# Patient Record
Sex: Male | Born: 1960 | Race: White | Hispanic: No | Marital: Single | Smoking: Never smoker
Health system: Southern US, Community
[De-identification: ages and names within clinical notes are randomized; demographics above are authoritative.]

## PROBLEM LIST (undated history)

## (undated) HISTORY — PX: KNEE SURGERY: SHX244

## (undated) HISTORY — PX: NASAL SINUS SURGERY: SHX719

---

## 2000-09-12 ENCOUNTER — Encounter: Admission: RE | Admit: 2000-09-12 | Discharge: 2000-09-12 | Payer: Self-pay | Admitting: Orthopedic Surgery

## 2000-09-12 ENCOUNTER — Encounter: Payer: Self-pay | Admitting: Orthopedic Surgery

## 2002-10-09 ENCOUNTER — Ambulatory Visit (HOSPITAL_COMMUNITY): Admission: RE | Admit: 2002-10-09 | Discharge: 2002-10-09 | Payer: Self-pay | Admitting: Internal Medicine

## 2017-05-15 ENCOUNTER — Other Ambulatory Visit: Payer: Self-pay | Admitting: Internal Medicine

## 2017-05-15 DIAGNOSIS — R1032 Left lower quadrant pain: Secondary | ICD-10-CM

## 2017-05-17 ENCOUNTER — Other Ambulatory Visit: Payer: Self-pay | Admitting: Internal Medicine

## 2017-05-17 ENCOUNTER — Ambulatory Visit
Admission: RE | Admit: 2017-05-17 | Discharge: 2017-05-17 | Disposition: A | Discharge status: Home / Self Care | Payer: 59 | Source: Ambulatory Visit | Attending: Internal Medicine | Admitting: Internal Medicine

## 2017-05-17 DIAGNOSIS — R1032 Left lower quadrant pain: Secondary | ICD-10-CM

## 2017-10-29 ENCOUNTER — Other Ambulatory Visit: Payer: Self-pay

## 2017-10-29 ENCOUNTER — Ambulatory Visit (HOSPITAL_COMMUNITY)
Admission: EM | Admit: 2017-10-29 | Discharge: 2017-10-29 | Disposition: A | Discharge status: Home / Self Care | Payer: 59 | Attending: Urgent Care | Admitting: Urgent Care

## 2017-10-29 ENCOUNTER — Ambulatory Visit (INDEPENDENT_AMBULATORY_CARE_PROVIDER_SITE_OTHER): Payer: 59

## 2017-10-29 ENCOUNTER — Encounter (HOSPITAL_COMMUNITY): Payer: Self-pay | Admitting: Emergency Medicine

## 2017-10-29 DIAGNOSIS — M25572 Pain in left ankle and joints of left foot: Secondary | ICD-10-CM | POA: Diagnosis not present

## 2017-10-29 DIAGNOSIS — S93402A Sprain of unspecified ligament of left ankle, initial encounter: Secondary | ICD-10-CM

## 2017-10-29 DIAGNOSIS — M25472 Effusion, left ankle: Secondary | ICD-10-CM

## 2017-10-29 MED ORDER — IBUPROFEN 800 MG PO TABS
ORAL_TABLET | ORAL | Status: AC
Start: 2017-10-29 — End: 2017-10-29
  Filled 2017-10-29: qty 1

## 2017-10-29 MED ORDER — NAPROXEN SODIUM 550 MG PO TABS: 550.0000 mg | ORAL_TABLET | Freq: Two times a day (BID) | ORAL | 1 refills | Status: DC

## 2017-10-29 MED ORDER — IBUPROFEN 800 MG PO TABS
800.0000 mg | ORAL_TABLET | Freq: Once | ORAL | Status: AC
  Administered 2017-10-29: 800 mg via ORAL

## 2017-10-29 NOTE — ED Triage Notes (Signed)
Pt states "I missed a step yesterday and turned my ankle, really sore and swollen".

## 2017-10-29 NOTE — ED Provider Notes (Signed)
  MRN: [id number] DOB: [birth year]  Subjective:   Jaime Kline is a 56 y.o. male presenting for left ankle pain, swelling, redness, bruising. He rolled his ankle yesterday while at home. Has not tried any medications for relief. He is having significant difficulty. Denies history of gout. Denies history of kidney or liver disease.  Jaime Kline is not currently taking any medications and is allergic to penicillins.  Jaime Kline denies past medical and surgical history.   Objective:   Vitals: BP 134/84   Pulse 87   Temp 98.4 F (36.9 C)   Resp 16   SpO2 97%   Physical Exam  Constitutional: He is oriented to person, place, and time. He appears well-developed and well-nourished.  Cardiovascular: Normal rate.  Pulmonary/Chest: Effort normal.  Musculoskeletal:       Left ankle: He exhibits decreased range of motion (inversion, eversion) and swelling. He exhibits no ecchymosis, no deformity, no laceration and normal pulse. Tenderness. Lateral malleolus and medial malleolus tenderness found. No AITFL, no CF ligament, no posterior TFL, no head of 5th metatarsal and no proximal fibula tenderness found. Achilles tendon exhibits no pain and no defect.  Neurological: He is alert and oriented to person, place, and time.   Dg Ankle Complete Left  Result Date: 10/29/2017 CLINICAL DATA:  56 year old male post twisting injury with pain. Initial encounter. EXAM: LEFT ANKLE COMPLETE - 3+ VIEW COMPARISON:  None. FINDINGS: No fracture or dislocation. Lateral malleolar soft tissue swelling may indicate underlying soft tissue injury. Accessory ossicle. Tiny plantar spur. Minimal degenerative changes distal fibula. IMPRESSION: No fracture or dislocation. Lateral malleolar soft tissue swelling may indicate underlying soft tissue injury. Electronically Signed   By: Lacy DuverneySteven  Olson M.D.   On: 10/29/2017 12:09   Assessment and Plan :   Sprain of left ankle, unspecified ligament, initial encounter  Acute left ankle  pain  Left ankle swelling   Counseled on RICE method for his sprained ankle. Patient prefers to pick up ankle brace and crutches on his own due to his insurance policy. Ankle rehab reviewed. Start Anaprox for pain and inflammation. Return-to-clinic precautions discussed, patient verbalized understanding.   Jaime BambergMario Latrell Potempa, PA-C [facility]  10/29/2017  11:57 AM    Jaime BambergMani, Angelle Isais, PA-C 10/29/17 1241

## 2018-12-19 IMAGING — DX DG ANKLE COMPLETE 3+V*L*
3 series · 3 of 3 positions shown · non-contrast
Comparison: None.

CLINICAL DATA: 56-year-old male post twisting injury with pain.
Initial encounter.

EXAM:
LEFT ANKLE COMPLETE - 3+ VIEW

[ankle ap]
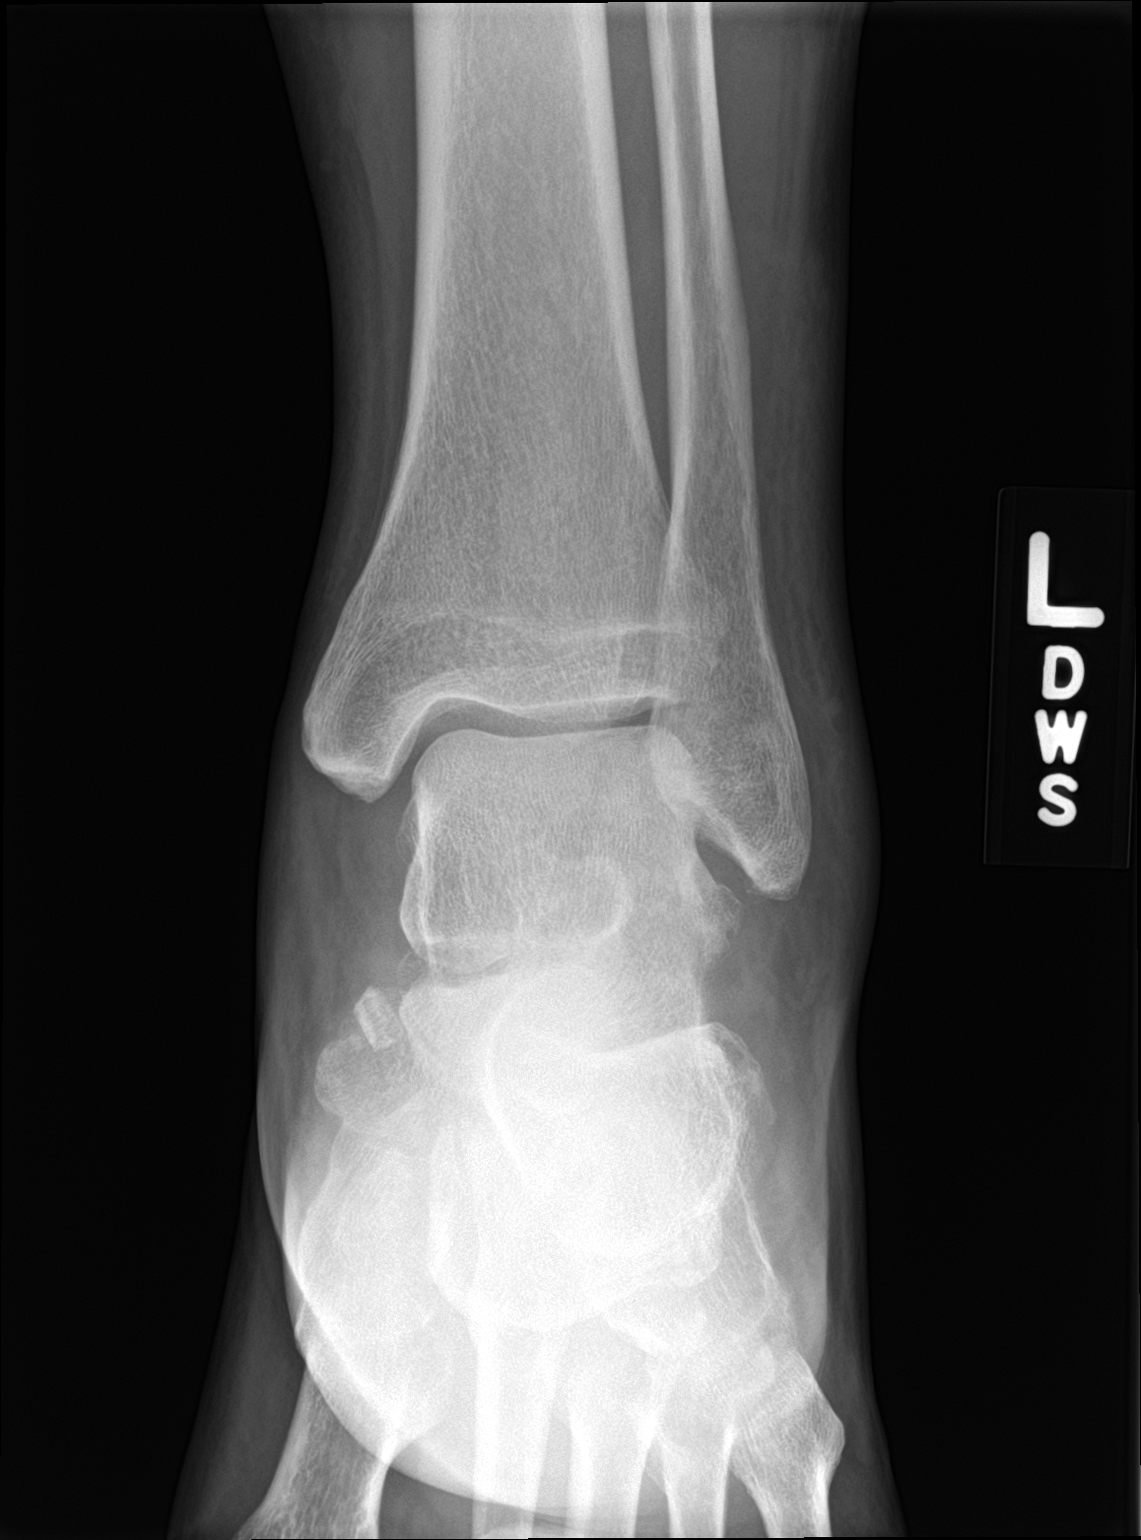

[ankle obl]
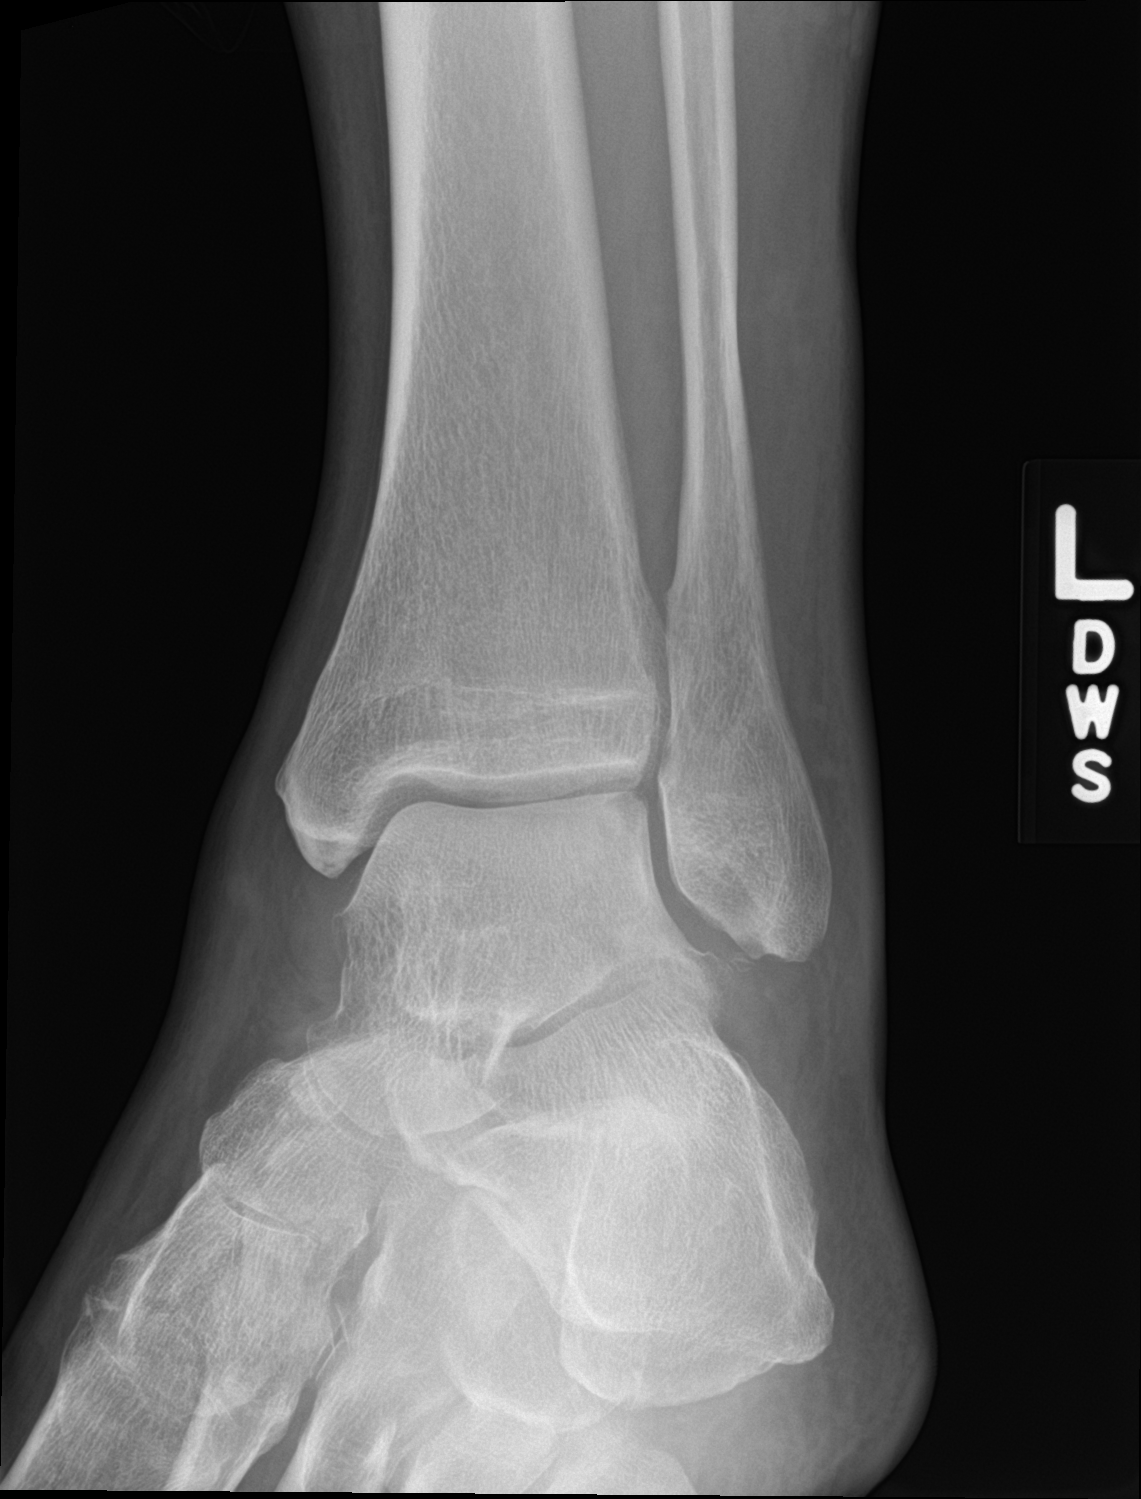

[ankle lat]
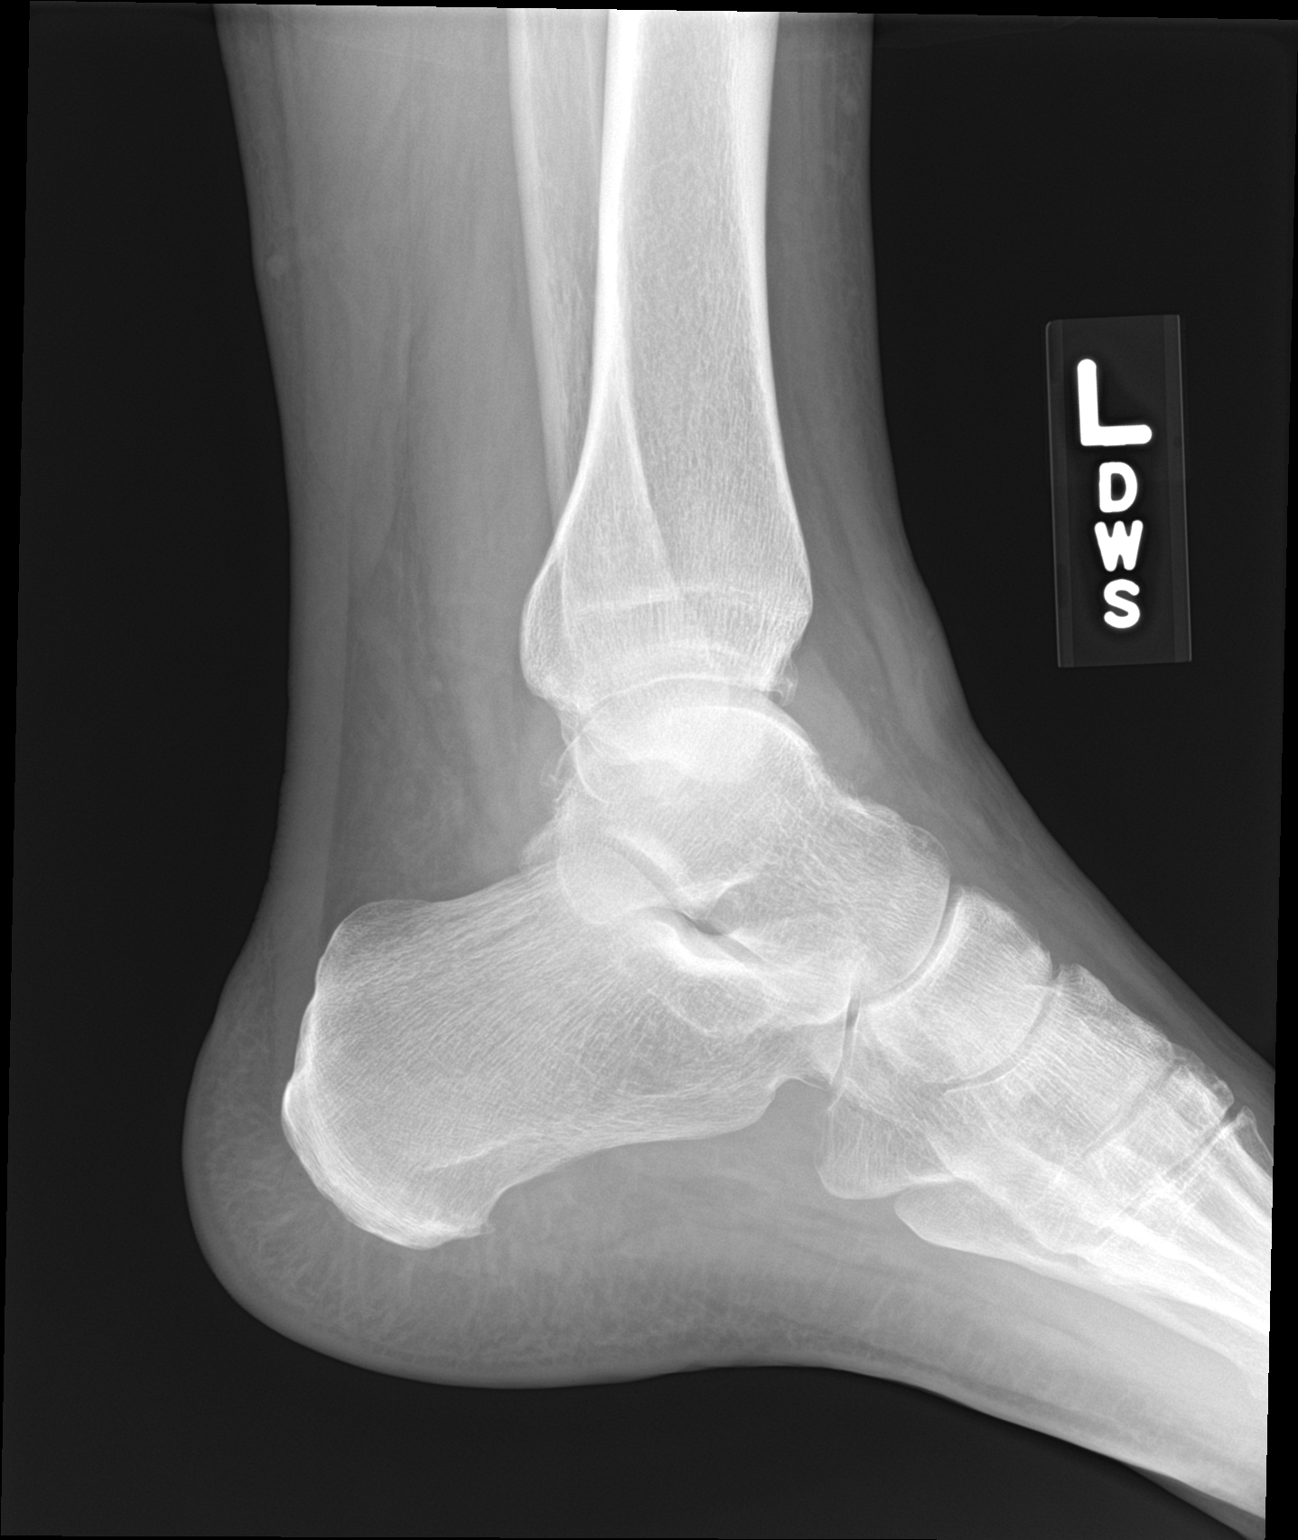

[3 of 3 positions shown; findings below may reference images not displayed]

FINDINGS: No fracture or dislocation.

Lateral malleolar soft tissue swelling may indicate underlying soft
tissue injury.

Accessory ossicle. Tiny plantar spur. Minimal degenerative changes
distal fibula.
IMPRESSION: No fracture or dislocation.

Lateral malleolar soft tissue swelling may indicate underlying soft
tissue injury.

## 2019-01-16 ENCOUNTER — Institutional Professional Consult (permissible substitution): Payer: 59 | Admitting: Internal Medicine

## 2019-01-16 ENCOUNTER — Encounter: Payer: Self-pay | Admitting: Pulmonary Disease

## 2019-01-16 ENCOUNTER — Ambulatory Visit: Payer: 59 | Admitting: Pulmonary Disease

## 2019-01-16 VITALS — BP 126/76 | HR 79 | Ht 69.0 in | Wt 214.2 lb

## 2019-01-16 DIAGNOSIS — J4551 Severe persistent asthma with (acute) exacerbation: Secondary | ICD-10-CM

## 2019-01-16 DIAGNOSIS — R05 Cough: Secondary | ICD-10-CM | POA: Diagnosis not present

## 2019-01-16 DIAGNOSIS — R059 Cough, unspecified: Secondary | ICD-10-CM

## 2019-01-16 LAB — NITRIC OXIDE: NITRIC OXIDE: 63

## 2019-01-16 MED ORDER — BUDESONIDE-FORMOTEROL FUMARATE 160-4.5 MCG/ACT IN AERO: 2.0000 | INHALATION_SPRAY | Freq: Two times a day (BID) | RESPIRATORY_TRACT | 5 refills | Status: DC

## 2019-01-16 MED ORDER — PREDNISONE 10 MG PO TABS: ORAL_TABLET | ORAL | 0 refills | Status: DC

## 2019-01-16 NOTE — Progress Notes (Signed)
Jaime Kline    [id number]    Dec 10, 1960  Primary Care Physician:Collins, Annabelle Harman, DO  Referring Physician: No referring provider defined for this encounter.  Chief complaint: Consult for cough, dyspnea, wheezing  HPI: 58 year old with no significant past medical history.  Complains of ongoing cough with wheezing, dyspnea for the past 1 year.  Symptoms significantly got worse over December after an episode of bronchitis.  He was seen in urgent care with a normal chest x-ray and given prednisone taper.  Symptoms are better while on prednisone but have recurred  Seen his primary care recently and started on Singulair and Zyrtec which are not helping with symptoms.  He is using his girlfriends albuterol which does help temporarily Has significant cough, dyspnea, wheezing, Daily nocturnal awakenings.  Cough is associated with white mucus.  No fevers, chills.  Denies seasonal allergies, postnasal drip or acid reflux.  Pets: No pets Occupation: Works as a Musician for [employer] Exposures: No known exposures, no mold, hot tub, Jacuzzi Smoking history: Never smoker Travel history: No significant travel history Relevant family history: No significant family history of lung disease.  Outpatient Encounter Medications as of 01/16/2019  Medication Sig  . albuterol (PROVENTIL HFA;VENTOLIN HFA) 108 (90 Base) MCG/ACT inhaler Inhale 2 puffs into the lungs every 6 (six) hours as needed for wheezing or shortness of breath.  Marland Kitchen aspirin 81 MG chewable tablet Chew by mouth daily.  . [DISCONTINUED] naproxen sodium (ANAPROX DS) 550 MG tablet Take 1 tablet (550 mg total) by mouth 2 (two) times daily with a meal.   No facility-administered encounter medications on file as of 01/16/2019.     Allergies as of 01/16/2019 - Review Complete 01/16/2019  Allergen Reaction Noted  . Penicillins  10/29/2017    No past medical history on file.  Past Surgical History:  Procedure  Laterality Date  . KNEE SURGERY    . NASAL SINUS SURGERY      Family History  Family history unknown: Yes    Social History   Socioeconomic History  . Marital status: Single    Spouse name: Not on file  . Number of children: Not on file  . Years of education: Not on file  . Highest education level: Not on file  Occupational History  . Not on file  Social Needs  . Financial resource strain: Not on file  . Food insecurity:    Worry: Not on file    Inability: Not on file  . Transportation needs:    Medical: Not on file    Non-medical: Not on file  Tobacco Use  . Smoking status: Never Smoker  . Smokeless tobacco: Never Used  Substance and Sexual Activity  . Alcohol use: Yes    Alcohol/week: 16.0 standard drinks    Types: 16 Shots of liquor per week  . Drug use: No  . Sexual activity: Not on file  Lifestyle  . Physical activity:    Days per week: Not on file    Minutes per session: Not on file  . Stress: Not on file  Relationships  . Social connections:    Talks on phone: Not on file    Gets together: Not on file    Attends religious service: Not on file    Active member of club or organization: Not on file    Attends meetings of clubs or organizations: Not on file    Relationship status: Not on file  .  Intimate partner violence:    Fear of current or ex partner: Not on file    Emotionally abused: Not on file    Physically abused: Not on file    Forced sexual activity: Not on file  Other Topics Concern  . Not on file  Social History Narrative  . Not on file    Review of systems: Review of Systems  Constitutional: Negative for fever and chills.  HENT: Negative.   Eyes: Negative for blurred vision.  Respiratory: as per HPI  Cardiovascular: Negative for chest pain and palpitations.  Gastrointestinal: Negative for vomiting, diarrhea, blood per rectum. Genitourinary: Negative for dysuria, urgency, frequency and hematuria.  Musculoskeletal: Negative for  myalgias, back pain and joint pain.  Skin: Negative for itching and rash.  Neurological: Negative for dizziness, tremors, focal weakness, seizures and loss of consciousness.  Endo/Heme/Allergies: Negative for environmental allergies.  Psychiatric/Behavioral: Negative for depression, suicidal ideas and hallucinations.  All other systems reviewed and are negative.  Physical Exam: Blood pressure 126/76, pulse 79, height 5\' 9"  (1.753 m), weight 214 lb 3.2 oz (97.2 kg), SpO2 97 %. Gen:      No acute distress HEENT:  EOMI, sclera anicteric Neck:     No masses; no thyromegaly Lungs:    Clear to auscultation bilaterally; normal respiratory effort CV:         Regular rate and rhythm; no murmurs Abd:      + bowel sounds; soft, non-tender; no palpable masses, no distension Ext:    No edema; adequate peripheral perfusion Skin:      Warm and dry; no rash Neuro: alert and oriented x 3 Psych: normal mood and affect  Data Reviewed: Imaging: Chest x-ray from primary care 11/15/2018- no acute cardiopulmonary abnormality.  PFTs:  FENO 01/16/2019-63  Labs:  From primary care CBC 01/03/2019-WBC 7, eos 10.3%, absolute eosinophil count 121  Assessment:  Acute asthma exacerbation Symptoms exacerbated by viral bronchitis in December but he has been symptomatic with milder symptoms for the past year Labs noted for significant eosinophilia at primary care office and elevated FENO He is wheezing in office today.  We will treat with a prednisone taper starting at milligram.  Reduce dose to 10 mg every 3 days Start Symbicort 160. Continue on Singulair and Zyrtec He will need PFTs after he is back to baseline  Follow up in 2 to 4 weeks.  Plan/Recommendations: - Pred taper, start Symbicort - Continue Singulair, Zyrtec  Chilton Greathouse MD [facility] 01/16/2019, 10:49 AM  CC: No ref. provider found

## 2019-01-16 NOTE — Patient Instructions (Signed)
We will give you a prednisone taper starting at 60 mg.  Reduce dose by 10 mg every 3 days We will start you on Symbicort 160.  Take 2 puffs in the morning and 2 puffs in the evening Continue on the Singulair and Zyrtec  Follow-up in 2 to 4 weeks.

## 2019-02-05 ENCOUNTER — Ambulatory Visit: Payer: 59 | Admitting: Pulmonary Disease

## 2019-02-19 ENCOUNTER — Ambulatory Visit: Payer: 59 | Admitting: Pulmonary Disease

## 2019-09-04 ENCOUNTER — Encounter: Payer: Self-pay | Admitting: Primary Care

## 2019-09-04 ENCOUNTER — Ambulatory Visit: Payer: 59 | Admitting: Primary Care

## 2019-09-04 ENCOUNTER — Other Ambulatory Visit: Payer: Self-pay

## 2019-09-04 ENCOUNTER — Telehealth: Payer: Self-pay | Admitting: Pulmonary Disease

## 2019-09-04 VITALS — BP 142/76 | HR 76 | Temp 97.2°F | Ht 69.0 in | Wt 216.0 lb

## 2019-09-04 DIAGNOSIS — J45901 Unspecified asthma with (acute) exacerbation: Secondary | ICD-10-CM | POA: Insufficient documentation

## 2019-09-04 DIAGNOSIS — J4541 Moderate persistent asthma with (acute) exacerbation: Secondary | ICD-10-CM | POA: Diagnosis not present

## 2019-09-04 DIAGNOSIS — J4551 Severe persistent asthma with (acute) exacerbation: Secondary | ICD-10-CM

## 2019-09-04 MED ORDER — DOXYCYCLINE HYCLATE 100 MG PO TABS: 100.0000 mg | ORAL_TABLET | Freq: Two times a day (BID) | ORAL | 0 refills | Status: DC

## 2019-09-04 MED ORDER — BUDESONIDE-FORMOTEROL FUMARATE 160-4.5 MCG/ACT IN AERO: 2.0000 | INHALATION_SPRAY | Freq: Two times a day (BID) | RESPIRATORY_TRACT | 5 refills | Status: DC

## 2019-09-04 MED ORDER — PREDNISONE 10 MG PO TABS: ORAL_TABLET | ORAL | 0 refills | Status: DC

## 2019-09-04 NOTE — Telephone Encounter (Signed)
Called the patient back and he stated he has been wheezing for last 4 - 6 weeks and has had green and brown congestion the last 4 weeks. Patient stated when he lays down he gets very congested. Patient denied fever and all covid questions were asked, which he responded no to all.  I offered the patient a video visit since he already declined a televisit and he stated he really wanted to be seen so that if he needed to get a chest xray it can be done at the same time.  Patient stated he has been using the Symbicort daily. However the albuterol he has been using was from his PCP and has been using it every 3 hours.  Patient scheduled for today at 11:30 with Derl Barrow, NP after the call was put on hold and I spoke with Dr. Ander Slade to get his opinion to see if it was ok for patient to be seen in clinic.

## 2019-09-04 NOTE — Progress Notes (Signed)
@Patient  ID: Jaime Kline, male    DOB: 12-04-1960, 58 y.o.   MRN: [id number]  Chief Complaint  Patient presents with  . Follow-up    Pt c/o increase in SOB, wheezing, trouble sleeping due to breathing trouble, prod cough with green mucus. Pt states he ran out of his symbicort 1 month ago. Pt states he is using the albuterol 4-6 times a day with 2-3 puffs at a time. Pt denies CP/tightness and f/c/s.     Referring provider: [name], DO  HPI: 58 year old male, never smoked. PMH significant for severe persistent asthma. Patient of Dr. Vaughan Browner, seen for initial consult on 01/16/19. Maintained on Symbicort 160, Singulair and zyrtec  09/04/2019 Patient presents today for acute visit with complaints of wheezing x 4-6 weeks. Associated productive cough with green mucus. Congestion is worse at night. States that he ran out of this Symbicort one month ago and hasn't taken it since. Continue Zyrtec and Singulair as prescribed but does not noticed much of a difference. He has been taking Albuterol rescue inhaler upwards for 4-5 times a day when he gets short of breath. No nasal congestion. Afebrile.    Data Reviewed: Imaging: Chest x-ray from primary care 11/15/2018- no acute cardiopulmonary abnormality.  Labs:  From primary care CBC 01/03/2019-WBC 7, eos 10.3%, absolute eosinophil count 121  FENO 01/16/2019-63  Allergies  Allergen Reactions  . Penicillins      There is no immunization history on file for this patient.  History reviewed. No pertinent past medical history.  Tobacco History: Social History   Tobacco Use  Smoking Status Never Smoker  Smokeless Tobacco Never Used   Counseling given: Not Answered   Outpatient Medications Prior to Visit  Medication Sig Dispense Refill  . albuterol (PROVENTIL HFA;VENTOLIN HFA) 108 (90 Base) MCG/ACT inhaler Inhale 2 puffs into the lungs every 6 (six) hours as needed for wheezing or shortness of breath.    . cetirizine (ZYRTEC) 10  MG tablet Take 10 mg by mouth daily.    . montelukast (SINGULAIR) 10 MG tablet Take 10 mg by mouth at bedtime.    Marland Kitchen aspirin 81 MG chewable tablet Chew by mouth daily.    . budesonide-formoterol (SYMBICORT) 160-4.5 MCG/ACT inhaler Inhale 2 puffs into the lungs 2 (two) times daily for 1 day. 1 Inhaler 5  . predniSONE (DELTASONE) 10 MG tablet 6 tabs x 3 days, 5 tabs x 3 days, 4 tabs x 3 days, 3 tabs x 3 days, 2 tab x 3 days, 1 tab x 3 days then stop 63 tablet 0   No facility-administered medications prior to visit.    Review of Systems  Review of Systems  Respiratory: Positive for cough, shortness of breath and wheezing.    Physical Exam  BP (!) 142/76 (BP Location: Left Arm, Cuff Size: Normal)   Pulse 76   Temp (!) 97.2 F (36.2 C) (Skin)   Ht 5\' 9"  (1.753 m)   Wt 216 lb (98 kg)   SpO2 96%   BMI 31.90 kg/m  Physical Exam Constitutional:      Appearance: Normal appearance.  HENT:     Head: Normocephalic and atraumatic.  Neck:     Musculoskeletal: Normal range of motion and neck supple.  Cardiovascular:     Rate and Rhythm: Normal rate and regular rhythm.  Pulmonary:     Breath sounds: Wheezing present. No rhonchi.     Comments: Insp/exp wheezing Musculoskeletal: Normal range of motion.  Skin:  General: Skin is warm and dry.  Neurological:     General: No focal deficit present.     Mental Status: He is alert and oriented to person, place, and time. Mental status is at baseline.  Psychiatric:        Mood and Affect: Mood normal.        Behavior: Behavior normal.        Thought Content: Thought content normal.        Judgment: Judgment normal.      Lab Results:  CBC No results found for: WBC, RBC, HGB, HCT, PLT, MCV, MCH, MCHC, RDW, LYMPHSABS, MONOABS, EOSABS, BASOSABS  BMET No results found for: NA, K, CL, CO2, GLUCOSE, BUN, CREATININE, CALCIUM, GFRNONAA, GFRAA  BNP No results found for: BNP  ProBNP No results found for: PROBNP  Imaging: No results found.    Assessment & Plan:   Acute asthma exacerbation - Increased wheezing x 4-6 weeks with acute bronchitis symptoms  - Declined depo-medrol shot - Prednisone taper (40mg  x 3 days; 30mg  x 3 days; 20mg  x 3 days; 10mg  x 3 days) - Resume Symbicort 160 two puffs twice daily; Albuterol hfa q 4-6 hours prn  - Recommend mucinex twice daily - Continue Singulair and Zyrtec daily  - FU in 6 months or sooner if needed     , NP 09/04/2019

## 2019-09-04 NOTE — Telephone Encounter (Signed)
Pt seen in office by Derl Barrow, NP, on 09/04/19. Nothing further needed at this time.

## 2019-09-04 NOTE — Assessment & Plan Note (Signed)
-   Increased wheezing x 4-6 weeks with acute bronchitis symptoms  - Declined depo-medrol shot - Prednisone taper (40mg  x 3 days; 30mg  x 3 days; 20mg  x 3 days; 10mg  x 3 days) - Resume Symbicort 160 two puffs twice daily; Albuterol hfa q 4-6 hours prn  - Recommend mucinex twice daily - Continue Singulair and Zyrtec daily  - FU in 6 months or sooner if needed

## 2019-09-04 NOTE — Patient Instructions (Addendum)
Pleasure meeting you today, I'm sorry that you are not feeling well  Treating you for acute asthma exacerbation/asthmatic bronchitis  Rx: Prednisone taper as prescribed (40mg  x 3 days; 30mg  x 3 days; 20mg  x 3 days; 10mg  x 3 days) Doxycycline 1 tab twice daily x 7 day  Recommendations: Take Mucinex 600mg  twice daily for congestion  Continue Symbicort two puffs twice daily Continue Singulair 10mg  at bedtime Continue Zyrtec 10mg  daily   Follow-up: Return/call if symptoms do not improve or worsne

## 2020-09-18 ENCOUNTER — Other Ambulatory Visit: Payer: Self-pay | Admitting: Primary Care

## 2021-12-16 ENCOUNTER — Ambulatory Visit: Payer: No Typology Code available for payment source | Admitting: Nurse Practitioner

## 2021-12-16 ENCOUNTER — Other Ambulatory Visit: Payer: Self-pay

## 2021-12-16 ENCOUNTER — Ambulatory Visit (INDEPENDENT_AMBULATORY_CARE_PROVIDER_SITE_OTHER): Payer: No Typology Code available for payment source

## 2021-12-16 ENCOUNTER — Encounter: Payer: Self-pay | Admitting: Nurse Practitioner

## 2021-12-16 VITALS — BP 110/70 | HR 88 | Temp 98.0°F | Ht 69.0 in | Wt 209.0 lb

## 2021-12-16 DIAGNOSIS — J4551 Severe persistent asthma with (acute) exacerbation: Secondary | ICD-10-CM

## 2021-12-16 DIAGNOSIS — J40 Bronchitis, not specified as acute or chronic: Secondary | ICD-10-CM | POA: Diagnosis not present

## 2021-12-16 DIAGNOSIS — J4550 Severe persistent asthma, uncomplicated: Secondary | ICD-10-CM

## 2021-12-16 LAB — NITRIC OXIDE: Nitric Oxide: 87

## 2021-12-16 MED ORDER — PREDNISONE 20 MG PO TABS: 40.0000 mg | ORAL_TABLET | Freq: Every day | ORAL | 0 refills | Status: AC

## 2021-12-16 MED ORDER — BUDESONIDE-FORMOTEROL FUMARATE 160-4.5 MCG/ACT IN AERO: 2.0000 | INHALATION_SPRAY | Freq: Two times a day (BID) | RESPIRATORY_TRACT | 6 refills | Status: AC

## 2021-12-16 MED ORDER — AZITHROMYCIN 250 MG PO TABS: ORAL_TABLET | ORAL | 0 refills | Status: AC

## 2021-12-16 MED ORDER — GUAIFENESIN ER 600 MG PO TB12: 600.0000 mg | ORAL_TABLET | Freq: Two times a day (BID) | ORAL | 3 refills | Status: AC

## 2021-12-16 NOTE — Patient Instructions (Addendum)
Start Symbicort 160 2 puffs Twice daily.  Brush tongue and rinse mouth afterwards -Continue albuterol 2 puffs every 6 hours as needed for shortness of breath/wheezing -Continue zyrtec 10 mg daily -Continue Singulair 10 mg At bedtime   -Prednisone 40 mg daily for 5 days. TAke in AM with food. -Z pack for 5 days. Take 2 tabs on day 1 followed by 1 tab for four days  Chest x ray today. WE will notify you of any abnormal results  CBC with diff and IgE when PCP draws labs on 3/1  Asthma Action Plan in place Rinse mouth after inhaled corticosteroid use.  Avoid triggers, when able.  Exercise encouraged. Notify if worsening symptoms upon exertion.  Notify and seek help if symptoms unrelieved by rescue inhaler.  Follow up in one month with Dr. Isaiah Serge or Philis Nettle. If symptoms do not improve or worsen, please contact office for sooner follow up or seek emergency care.

## 2021-12-16 NOTE — Progress Notes (Signed)
@Patient  ID: Jaime Kline, male    DOB: [birth year], 62 y.o.   MRN: [id number]  Chief Complaint  Patient presents with   Follow-up    Patient feels congested and wheezing and having to use his albuterol and worse in the last year,     Referring provider: [name], DO  HPI: 61 year old male, never smoker followed for severe persistent asthma. He is a patient of Dr. Matilde Bash and last seen in office on 09/04/2019 by Jaime Napoleon, NP. Past medical history significant for gout and GERD.  TEST/EVENTS:. 01/03/2019 CBC: WBC 7, eos 10.3%, absolute eosinophils 121 01/16/2019 FeNO: 63 ppb  09/04/2019: OV with Jaime Napoleon NP.  Acute flare with cough and congestion.  Previously ran out of Symbicort.  Prednisone taper.  Advised to resume Symbicort 2 puffs twice a day.  Mucinex twice a day continued Singulair and Zyrtec.  Follow-up 6 months or sooner.  12/16/2021: Today-acute Patient presents today for reported chest congestion and wheezing.  He has noticed an increase in this over the past few weeks however has been having flares over the last year.  He has an occasional cough as well.  He denies shortness of breath, orthopnea, PND, chest pain, lower extremity swelling.  He has not been taking his Symbicort.  He continues on Singulair and Zyrtec.  He has been using his rescue inhaler a few times a day.  FeNO 87 ppb  Allergies  Allergen Reactions   Penicillins Diarrhea     There is no immunization history on file for this patient.  No past medical history on file.  Tobacco History: Social History   Tobacco Use  Smoking Status Never  Smokeless Tobacco Never   Counseling given: Not Answered   Outpatient Medications Prior to Visit  Medication Sig Dispense Refill   albuterol (PROVENTIL HFA;VENTOLIN HFA) 108 (90 Base) MCG/ACT inhaler Inhale 2 puffs into the lungs every 6 (six) hours as needed for wheezing or shortness of breath.     allopurinol (ZYLOPRIM) 100 MG tablet Take 100 mg by mouth daily.      cetirizine (ZYRTEC) 10 MG tablet Take 10 mg by mouth daily.     ezetimibe (ZETIA) 10 MG tablet Take 10 mg by mouth daily.     montelukast (SINGULAIR) 10 MG tablet Take 10 mg by mouth at bedtime.     pantoprazole (PROTONIX) 40 MG tablet Take 40 mg by mouth daily.     aspirin 81 MG chewable tablet Chew by mouth daily. (Patient not taking: Reported on 12/16/2021)     budesonide-formoterol (SYMBICORT) 160-4.5 MCG/ACT inhaler Inhale 2 puffs into the lungs in the Kline and at bedtime. (Patient not taking: Reported on 12/16/2021) 10.2 g 0   doxycycline (VIBRA-TABS) 100 MG tablet Take 1 tablet (100 mg total) by mouth 2 (two) times daily. (Patient not taking: Reported on 12/16/2021) 14 tablet 0   predniSONE (DELTASONE) 10 MG tablet Take 4 tabs po daily x 3 days; then 3 tabs daily x3 days; then 2 tabs daily x3 days; then 1 tab daily x 3 days; then stop (Patient not taking: Reported on 12/16/2021) 30 tablet 0   No facility-administered medications prior to visit.     Review of Systems:   Constitutional: No weight loss or gain, night sweats, fevers, chills, fatigue, or lassitude. HEENT: No headaches, difficulty swallowing, tooth/dental problems, or sore throat. No sneezing, itching, ear ache, nasal congestion, or post nasal drip CV:  No chest pain, orthopnea, PND, swelling in lower extremities, anasarca,  dizziness, palpitations, syncope Resp: + Wheezing, chest congestion; occasional productive cough with yellow to green mucus.  No shortness of breath with exertion or at rest. No excess mucus or change in color of mucus. No hemoptysis. No chest wall deformity GI:  No heartburn, indigestion, abdominal pain, nausea, vomiting, diarrhea, change in bowel habits, loss of appetite, bloody stools.  GU: No dysuria, change in color of urine, urgency or frequency.  No flank pain, no hematuria  Skin: No rash, lesions, ulcerations MSK:  No joint pain or swelling.  No decreased range of motion.  No back pain. Neuro: No  dizziness or lightheadedness.  Psych: No depression or anxiety. Mood stable.     Physical Exam:  BP 110/70 (BP Location: Left Arm, Patient Position: Sitting, Cuff Size: Large)    Pulse 88    Temp 98 F (36.7 C) (Oral)    Ht 5\' 9"  (1.753 m)    Wt 209 lb (94.8 kg)    SpO2 96%    BMI 30.86 kg/m   GEN: Pleasant, interactive, well-nourished; in no acute distress. HEENT:  Normocephalic and atraumatic. EACs patent bilaterally. TM pearly gray with present light reflex bilaterally. PERRLA. Sclera white. Nasal turbinates pink, moist and patent bilaterally. No rhinorrhea present. Oropharynx pink and moist, without exudate or edema. No lesions, ulcerations, or postnasal drip.  NECK:  Supple w/ fair ROM. No JVD present. Normal carotid impulses w/o bruits. Thyroid symmetrical with no goiter or nodules palpated. No lymphadenopathy.   CV: RRR, no m/r/g, no peripheral edema. Pulses intact, +2 bilaterally. No cyanosis, pallor or clubbing. PULMONARY:  Unlabored, regular breathing. Minimal scattered end expiratory wheeze RLL; otherwise clear bilaterally A&P w/o wheezes/rales/rhonchi. No accessory muscle use. No dullness to percussion. GI: BS present and normoactive. Soft, non-tender to palpation. No organomegaly or masses detected. No CVA tenderness. MSK: No erythema, warmth or tenderness. Cap refil <2 sec all extrem. No deformities or joint swelling noted.  Neuro: A/Ox3. No focal deficits noted.   Skin: Warm, no lesions or rashe Psych: Normal affect and behavior. Judgement and thought content appropriate.     Lab Results:  CBC No results found for: WBC, RBC, HGB, HCT, PLT, MCV, MCH, MCHC, RDW, LYMPHSABS, MONOABS, EOSABS, BASOSABS  BMET No results found for: NA, K, CL, CO2, GLUCOSE, BUN, CREATININE, CALCIUM, GFRNONAA, GFRAA  BNP No results found for: BNP   Imaging:  12/16/2021: CXR reviewed by me. Lungs clear without any consolidations or infiltrates. No acute cardiopulm disease  DG Chest 2  View  Result Date: 12/16/2021 CLINICAL DATA:  Shortness of breath and productive cough. EXAM: CHEST - 2 VIEW COMPARISON:  None. FINDINGS: The lungs are clear without focal pneumonia, edema, pneumothorax or pleural effusion. The cardiopericardial silhouette is within normal limits for size. The visualized bony structures of the thorax show no acute abnormality. IMPRESSION: No active cardiopulmonary disease. Electronically Signed   By: Misty Stanley M.D.   On: 12/16/2021 17:31      No flowsheet data found.  Lab Results  Component Value Date   NITRICOXIDE 87 12/16/2021        Assessment & Plan:   Severe persistent asthmatic bronchitis with exacerbation Current mild flare. CXR negative. FeNO elevated. Restart Symbicort 160 2 puffs Twice daily. Discussed the importance of daily compliance and re-educated on need for maintenance inhaler and that being non-compliant with this is what's causing his recurrent exacerbations. Prednisone 40 mg daily for 5 days. Mucinex 600 mg Twice daily. Z pack.Continue singulair and zyrtec. Will obtain  CBC with diff and IgE when he sees his PCP for labs on 3/1.   Patient Instructions  Start Symbicort 160 2 puffs Twice daily.  Brush tongue and rinse mouth afterwards -Continue albuterol 2 puffs every 6 hours as needed for shortness of breath/wheezing -Continue zyrtec 10 mg daily -Continue Singulair 10 mg At bedtime   -Prednisone 40 mg daily for 5 days. TAke in AM with food. -Z pack for 5 days. Take 2 tabs on day 1 followed by 1 tab for four days  Chest x ray today. WE will notify you of any abnormal results  CBC with diff and IgE when PCP draws labs on 3/1  Asthma Action Plan in place Rinse mouth after inhaled corticosteroid use.  Avoid triggers, when able.  Exercise encouraged. Notify if worsening symptoms upon exertion.  Notify and seek help if symptoms unrelieved by rescue inhaler.  Follow up in one month with Dr. Vaughan Browner or Alanson Aly. If symptoms  do not improve or worsen, please contact office for sooner follow up or seek emergency care.    Clayton Bibles, NP 12/17/2021  Pt aware and understands NP's role.

## 2021-12-17 ENCOUNTER — Encounter: Payer: Self-pay | Admitting: Nurse Practitioner

## 2021-12-17 NOTE — Assessment & Plan Note (Signed)
Current mild flare. CXR negative. FeNO elevated. Restart Symbicort 160 2 puffs Twice daily. Discussed the importance of daily compliance and re-educated on need for maintenance inhaler and that being non-compliant with this is what's causing his recurrent exacerbations. Prednisone 40 mg daily for 5 days. Mucinex 600 mg Twice daily. Z pack.Continue singulair and zyrtec. Will obtain CBC with diff and IgE when he sees his PCP for labs on 3/1.   Patient Instructions  Start Symbicort 160 2 puffs Twice daily.  Brush tongue and rinse mouth afterwards -Continue albuterol 2 puffs every 6 hours as needed for shortness of breath/wheezing -Continue zyrtec 10 mg daily -Continue Singulair 10 mg At bedtime   -Prednisone 40 mg daily for 5 days. TAke in AM with food. -Z pack for 5 days. Take 2 tabs on day 1 followed by 1 tab for four days  Chest x ray today. WE will notify you of any abnormal results  CBC with diff and IgE when PCP draws labs on 3/1  Asthma Action Plan in place Rinse mouth after inhaled corticosteroid use.  Avoid triggers, when able.  Exercise encouraged. Notify if worsening symptoms upon exertion.  Notify and seek help if symptoms unrelieved by rescue inhaler.  Follow up in one month with Dr. Isaiah Serge or Philis Nettle. If symptoms do not improve or worsen, please contact office for sooner follow up or seek emergency care.

## 2022-02-16 ENCOUNTER — Telehealth: Payer: Self-pay | Admitting: Nurse Practitioner

## 2022-02-17 NOTE — Telephone Encounter (Signed)
Called and spoke with patient's primary care provider. Told her what labs were needed. She added on the labs. Called and spoke with patient to let him know I spoke with his pcp for the labs we needed. He verbalized understanding. Nothing further needed.  ?

## 2022-03-16 NOTE — Progress Notes (Signed)
PCP faxed labs from 02/16/2022. Eosinophils 300 on CBC with diff. Does not look like they drew IgE or we have yet to receive. Currently on singulair. CMA to follow up with patient as it does not appear he followed up after starting back on Symbicort as requested.

## 2023-02-05 IMAGING — DX DG CHEST 2V
2 series · 2 of 2 positions shown · non-contrast
Comparison: None.

CLINICAL DATA: Shortness of breath and productive cough.

EXAM:
CHEST - 2 VIEW

[chest pa]
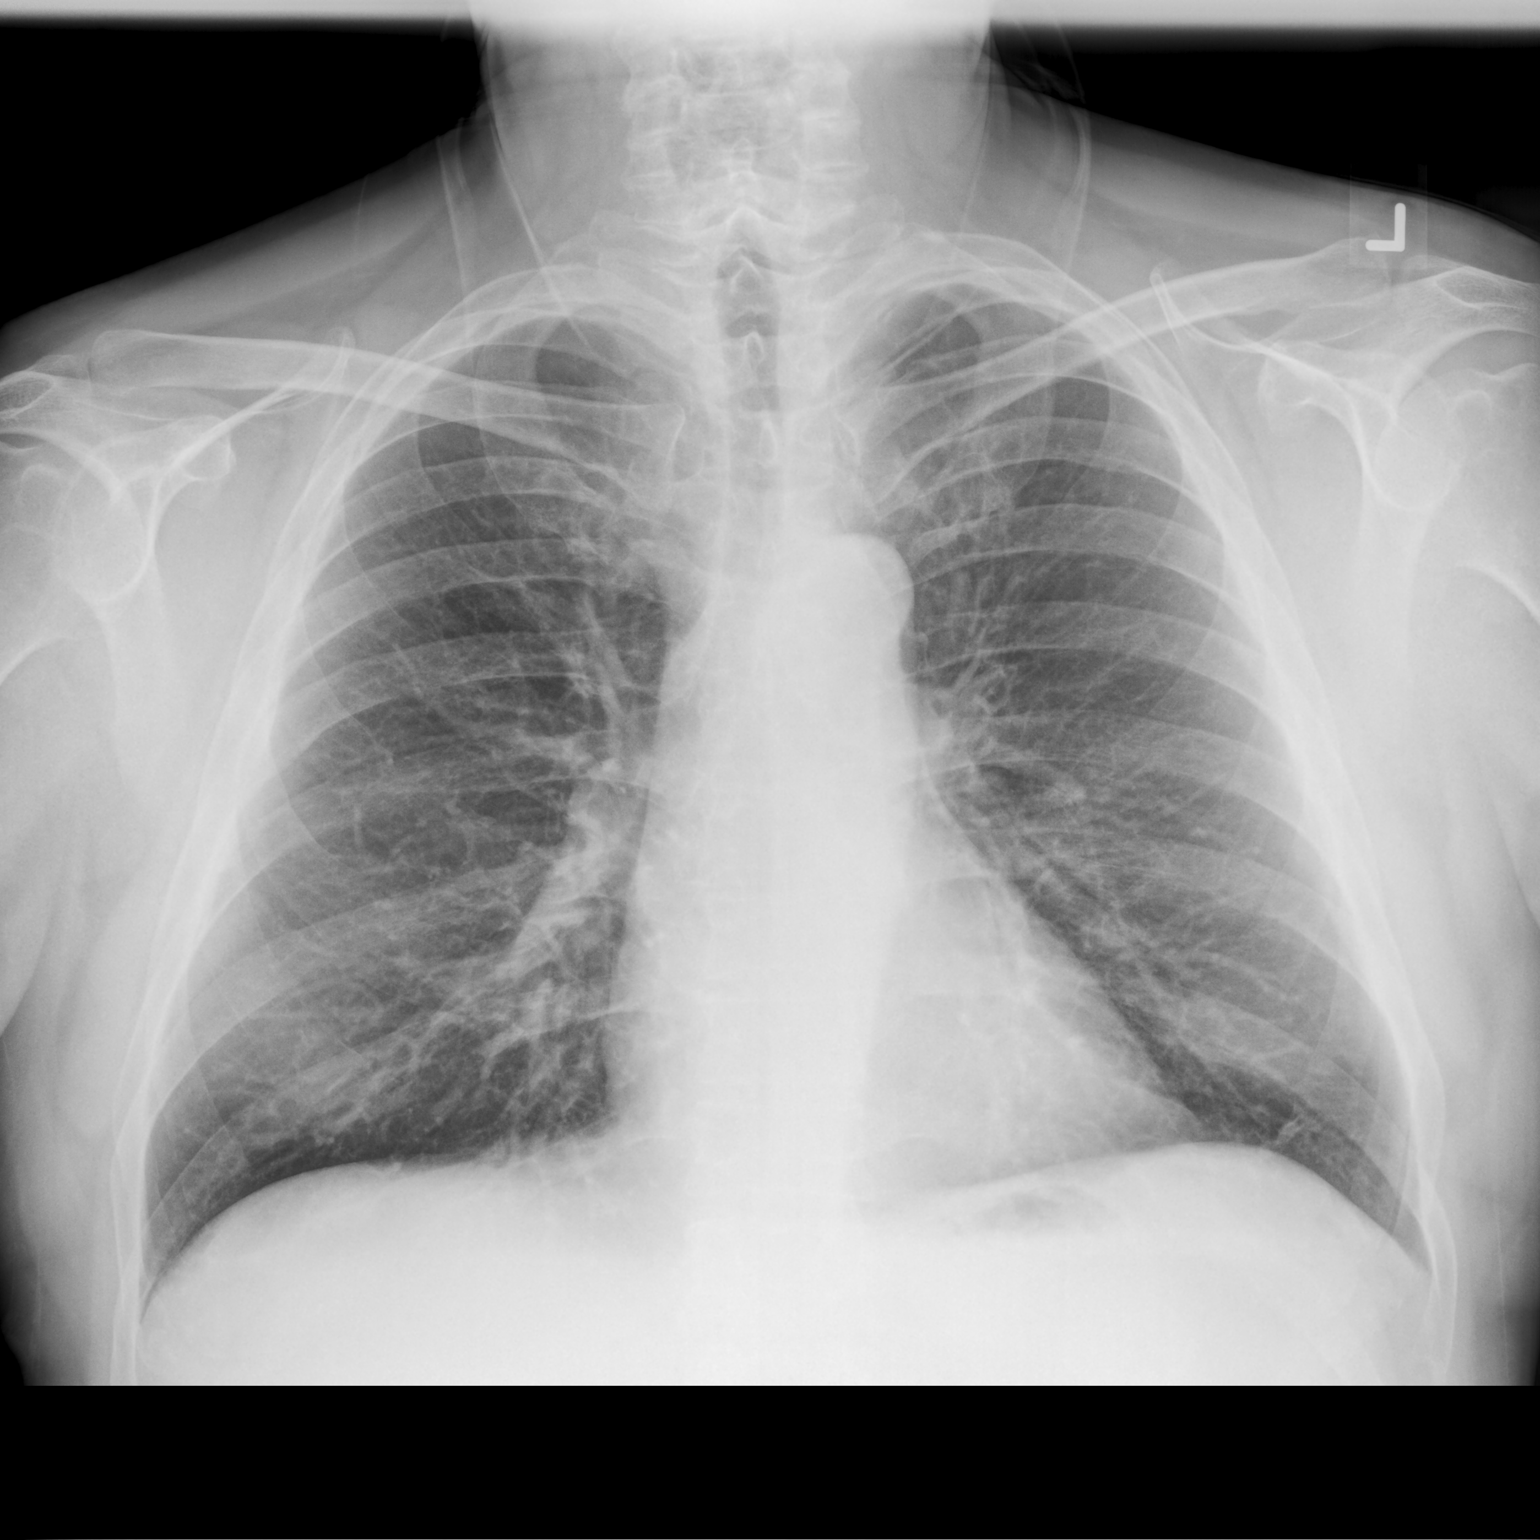

[chest lat]
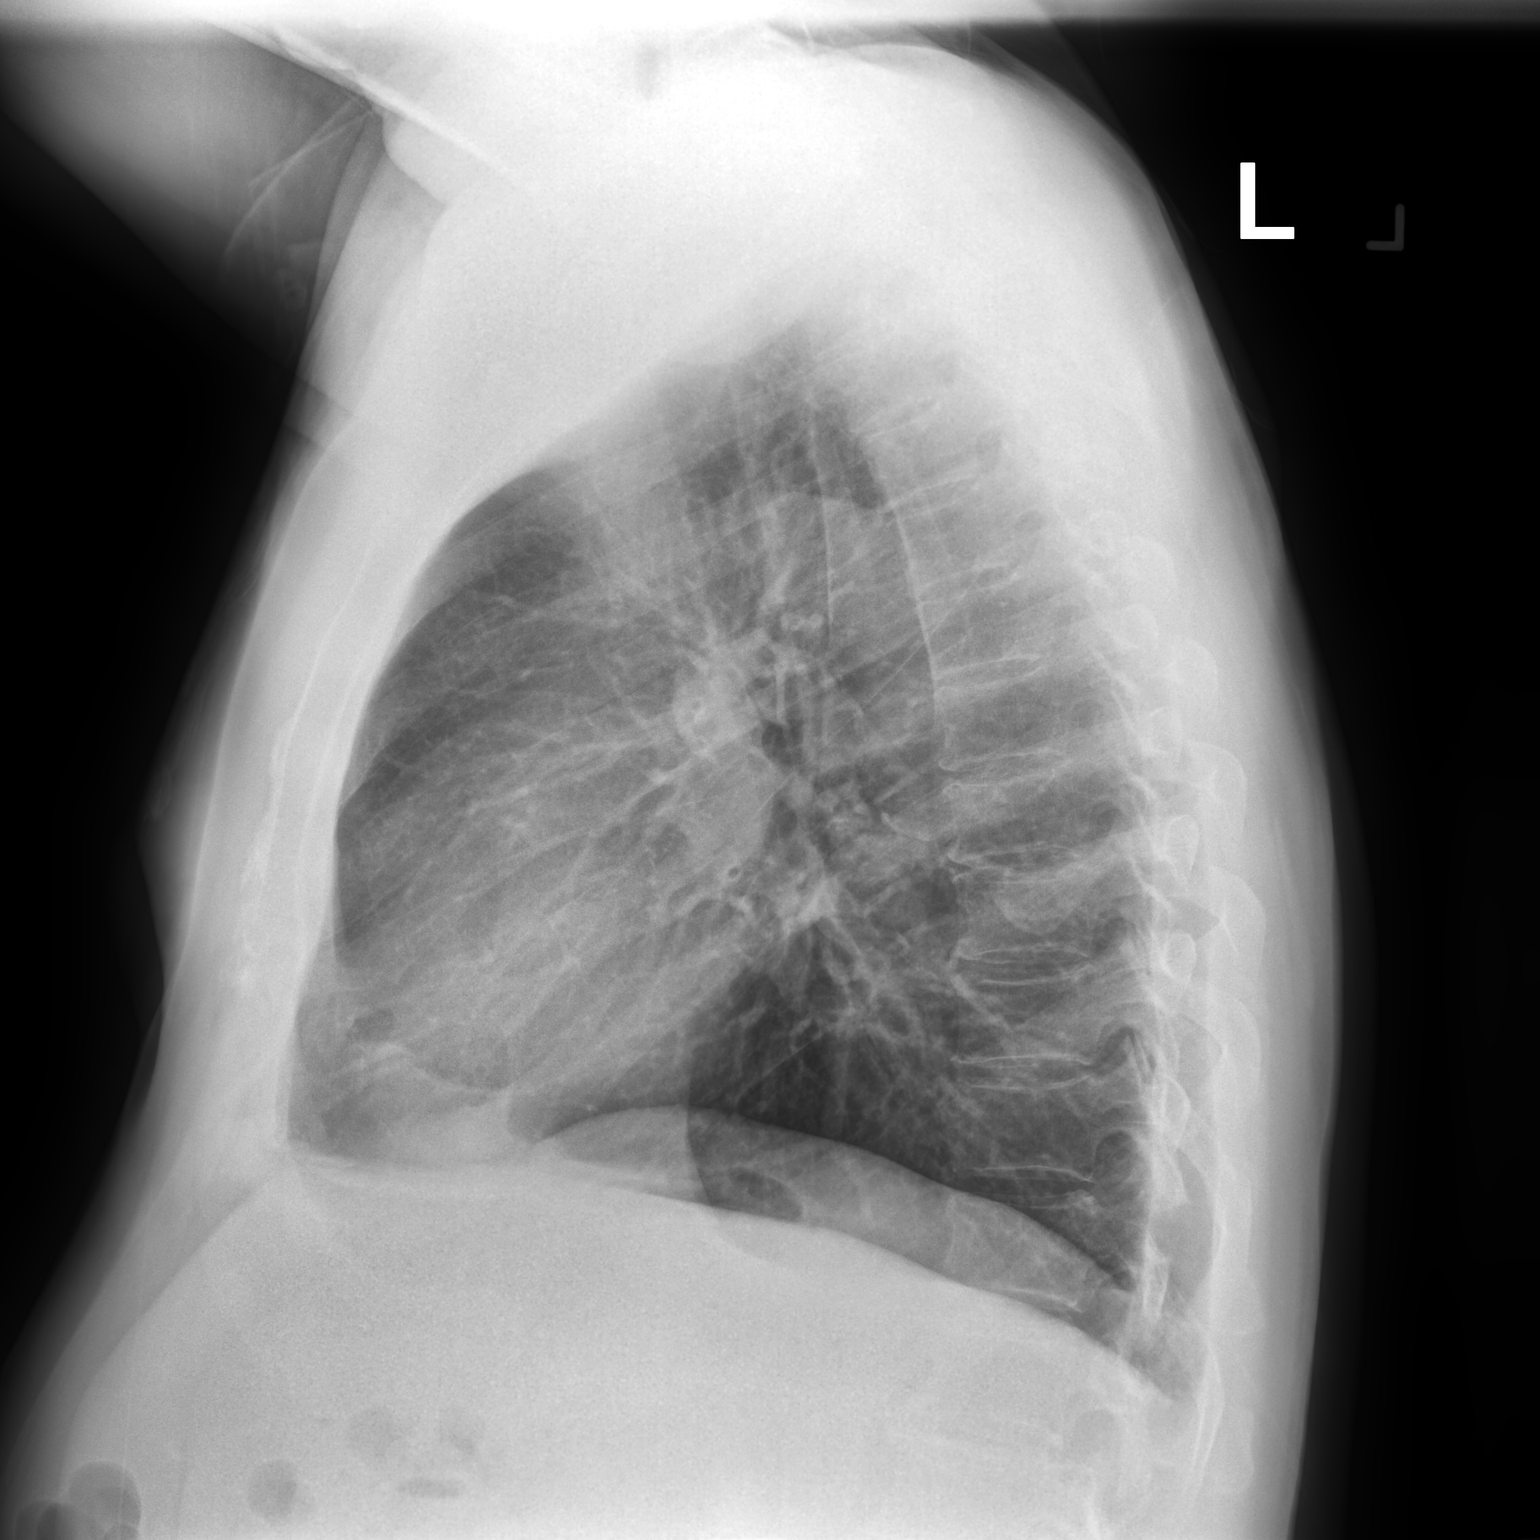

[2 of 2 positions shown; findings below may reference images not displayed]

FINDINGS: The lungs are clear without focal pneumonia, edema, pneumothorax or
pleural effusion. The cardiopericardial silhouette is within normal
limits for size. The visualized bony structures of the thorax show
no acute abnormality.
IMPRESSION: No active cardiopulmonary disease.

## 2023-04-06 ENCOUNTER — Other Ambulatory Visit: Payer: Self-pay | Admitting: Family Medicine

## 2023-04-06 DIAGNOSIS — E7800 Pure hypercholesterolemia, unspecified: Secondary | ICD-10-CM

## 2023-05-10 ENCOUNTER — Ambulatory Visit
Admission: RE | Admit: 2023-05-10 | Discharge: 2023-05-10 | Disposition: A | Discharge status: Home / Self Care | Payer: No Typology Code available for payment source | Source: Ambulatory Visit | Attending: Family Medicine | Admitting: Family Medicine

## 2023-05-10 DIAGNOSIS — E7800 Pure hypercholesterolemia, unspecified: Secondary | ICD-10-CM
